# Patient Record
Sex: Male | Born: 1985 | Race: White | Hispanic: No
Health system: Southern US, Community
[De-identification: ages and names within clinical notes are randomized; demographics above are authoritative.]

---

## 2020-01-16 ENCOUNTER — Ambulatory Visit (INDEPENDENT_AMBULATORY_CARE_PROVIDER_SITE_OTHER): Payer: Self-pay

## 2020-01-16 ENCOUNTER — Other Ambulatory Visit: Payer: Self-pay

## 2020-01-16 ENCOUNTER — Encounter: Payer: Self-pay | Admitting: Family Medicine

## 2020-01-16 ENCOUNTER — Ambulatory Visit (INDEPENDENT_AMBULATORY_CARE_PROVIDER_SITE_OTHER): Payer: Self-pay | Admitting: Family Medicine

## 2020-01-16 VITALS — BP 142/90 | HR 83 | Ht 73.0 in | Wt 141.0 lb

## 2020-01-16 DIAGNOSIS — M79604 Pain in right leg: Secondary | ICD-10-CM

## 2020-01-16 DIAGNOSIS — G8929 Other chronic pain: Secondary | ICD-10-CM

## 2020-01-16 DIAGNOSIS — M2550 Pain in unspecified joint: Secondary | ICD-10-CM

## 2020-01-16 DIAGNOSIS — M545 Low back pain, unspecified: Secondary | ICD-10-CM | POA: Insufficient documentation

## 2020-01-16 DIAGNOSIS — M542 Cervicalgia: Secondary | ICD-10-CM

## 2020-01-16 DIAGNOSIS — M79605 Pain in left leg: Secondary | ICD-10-CM

## 2020-01-16 DIAGNOSIS — M5440 Lumbago with sciatica, unspecified side: Secondary | ICD-10-CM

## 2020-01-16 MED ORDER — PREDNISONE 50 MG PO TABS: ORAL_TABLET | ORAL | 0 refills | Status: DC

## 2020-01-16 NOTE — Progress Notes (Signed)
[facility] [address] [place] [address] Phone: [phone] Subjective:   I Ronelle Nigh am serving as a Neurosurgeon for Dr. Antoine Primas.  This visit occurred during the SARS-CoV-2 public health emergency.  Safety protocols were in place, including screening questions prior to the visit, additional usage of staff PPE, and extensive cleaning of exam room while observing appropriate contact time as indicated for disinfecting solutions.   I'm seeing this patient by the request  of:  Patient, No Pcp Per  CC: Back and neck pain  [id number]  Carl Jenkins is a 34 y.o. male coming in with complaint of back and neck pain. Pain for 2 years. States he has had pain since 5 years ago. Patient states he was moving something one day and has felt pain since. Numbness and tingling off and on in the feet (Has lasted longer than 6 hours). Has been stretching, ice, ibuprofen, tylenol, gabapentin and heat. Flexion, standing and sitting for long periods of time causes pain. 9/10 at its worse. Neck ROM is limited. Pain causes headaches at time. Patient is still some other specialist previously.  Has had work-up including some laboratory work-up which was reviewed by me today.  Reviewing some of his lines show negative rheumatoid factor, negative Lyme titers regular CBC.  Patient also had an MRI of the lumbar spine was independently visualized by me.  MRI does show the patient has some very mild degenerative disc disease but otherwise no significant nerve root impingement noted.       Social History   Socioeconomic History  . Marital status: Unknown    Spouse name: Not on file  . Number of children: Not on file  . Years of education: Not on file  . Highest education level: Not on file  Occupational History  . Not on file  Tobacco Use  . Smoking status: Not on file  Substance and Sexual Activity  . Alcohol use: Not on file  . Drug use: Not on file  . Sexual  activity: Not on file  Other Topics Concern  . Not on file  Social History Narrative  . Not on file   Social Determinants of Health   Financial Resource Strain:   . Difficulty of Paying Living Expenses: Not on file  Food Insecurity:   . Worried About Programme researcher, broadcasting/film/video in the Last Year: Not on file  . Ran Out of Food in the Last Year: Not on file  Transportation Needs:   . Lack of Transportation (Medical): Not on file  . Lack of Transportation (Non-Medical): Not on file  Physical Activity:   . Days of Exercise per Week: Not on file  . Minutes of Exercise per Session: Not on file  Stress:   . Feeling of Stress : Not on file  Social Connections:   . Frequency of Communication with Friends and Family: Not on file  . Frequency of Social Gatherings with Friends and Family: Not on file  . Attends Religious Services: Not on file  . Active Member of Clubs or Organizations: Not on file  . Attends Banker Meetings: Not on file  . Marital Status: Not on file   Not on File No family history on file.  Current Outpatient Medications (Endocrine & Metabolic):  .  predniSONE (DELTASONE) 50 MG tablet, 1 tablet by mouth daily        Reviewed prior external information including notes and imaging from  primary care provider  As well as notes that were available from care everywhere and other healthcare systems.  Past medical history, social, surgical and family history all reviewed in electronic medical record.  No pertanent information unless stated regarding to the chief complaint.   Review of Systems:  No headache, visual changes, nausea, vomiting, diarrhea, constipation, dizziness, abdominal pain, skin rash, fevers, chills, night sweats,  swollen lymph nodes,, joint swelling, chest pain, shortness of breath, mood changes. POSITIVE muscle aches, body aches, weight loss of 40 pounds over the last 2 years  Objective  Blood pressure (!) 142/90, pulse 83, height 6\' 1"   (1.854 m), weight 141 lb (64 kg), SpO2 97 %.   General: No apparent distress alert and oriented x3 mood and affect normal, dressed appropriately.  HEENT: Pupils equal, extraocular movements intact  Respiratory: Patient's speak in full sentences and does not appear short of breath  Patient has 3+ DTRs that are symmetric.  Patient does have 1 beat of clonus of the lower extremities bilaterally.  Significant stiffness noted of the lower extremities. Cardiovascular: No lower extremity edema, non tender, no erythema.  Gait normal with good balance and coordination.  MSK: Low back exam does have significant loss of lordosis.  Patient has very minimal flexion of the back at all.  Patient is neurovascularly intact distally.  Strength 4 out of 5 but symmetric.    Impression and Recommendations:     The above documentation has been reviewed and is accurate and complete , DO       Note: This dictation was prepared with Dragon dictation along with smaller phrase technology. Any transcriptional errors that result from this process are unintentional.

## 2020-01-16 NOTE — Patient Instructions (Addendum)
Good to see you.  Labs today Neck xray Prednisone 50 mg daily for 5 days Nerve conduction study bilateral LE they will call you to schedule  See me again in 5-6 weeks

## 2020-01-16 NOTE — Assessment & Plan Note (Addendum)
patient has had low back pain for no significant amount of time at this time.  We discussed which activities to do which wants to avoid.  Patient has had some laboratory work done but not significant and I would like to rule out all autoimmune.  Ionized calcium levels and I do think a nerve conduction test would be beneficial with patient's MRI being fairly unremarkable.  5-day burst of prednisone given as well to further evaluate.  After further work-up we will see how patient is doing and how he responds to the prednisone and discuss further.

## 2020-01-17 ENCOUNTER — Encounter: Payer: Self-pay | Admitting: Neurology

## 2020-01-18 LAB — COMPREHENSIVE METABOLIC PANEL
AG Ratio: 2.5 (calc) (ref 1.0–2.5)
ALT: 18 U/L (ref 9–46)
AST: 17 U/L (ref 10–40)
Albumin: 4.9 g/dL (ref 3.6–5.1)
Alkaline phosphatase (APISO): 54 U/L (ref 36–130)
BUN: 8 mg/dL (ref 7–25)
CO2: 30 mmol/L (ref 20–32)
Calcium: 9.7 mg/dL (ref 8.6–10.3)
Chloride: 101 mmol/L (ref 98–110)
Creat: 0.93 mg/dL (ref 0.60–1.35)
Globulin: 2 g/dL (calc) (ref 1.9–3.7)
Glucose, Bld: 95 mg/dL (ref 65–99)
Potassium: 3.7 mmol/L (ref 3.5–5.3)
Sodium: 139 mmol/L (ref 135–146)
Total Bilirubin: 0.8 mg/dL (ref 0.2–1.2)
Total Protein: 6.9 g/dL (ref 6.1–8.1)

## 2020-01-18 LAB — CBC WITH DIFFERENTIAL/PLATELET
Absolute Monocytes: 422 cells/uL (ref 200–950)
Basophils Absolute: 30 cells/uL (ref 0–200)
Basophils Relative: 0.4 %
Eosinophils Absolute: 30 cells/uL (ref 15–500)
Eosinophils Relative: 0.4 %
HCT: 43.1 % (ref 38.5–50.0)
Hemoglobin: 14.4 g/dL (ref 13.2–17.1)
Lymphs Abs: 1473 cells/uL (ref 850–3900)
MCH: 31.1 pg (ref 27.0–33.0)
MCHC: 33.4 g/dL (ref 32.0–36.0)
MCV: 93.1 fL (ref 80.0–100.0)
MPV: 10.5 fL (ref 7.5–12.5)
Monocytes Relative: 5.7 %
Neutro Abs: 5446 cells/uL (ref 1500–7800)
Neutrophils Relative %: 73.6 %
Platelets: 248 10*3/uL (ref 140–400)
RBC: 4.63 10*6/uL (ref 4.20–5.80)
RDW: 11.5 % (ref 11.0–15.0)
Total Lymphocyte: 19.9 %
WBC: 7.4 10*3/uL (ref 3.8–10.8)

## 2020-01-18 LAB — RPR: RPR Ser Ql: NONREACTIVE

## 2020-01-18 LAB — CK: Total CK: 93 U/L (ref 44–196)

## 2020-01-18 LAB — TESTOSTERONE TOTAL,FREE,BIO, MALES
Albumin: 4.9 g/dL (ref 3.6–5.1)
Sex Hormone Binding: 87 nmol/L — ABNORMAL HIGH (ref 10–50)
Testosterone, Bioavailable: 91.3 ng/dL — ABNORMAL LOW (ref >=110.0)
Testosterone, Free: 40.9 pg/mL — ABNORMAL LOW (ref 46.0–224.0)
Testosterone: 715 ng/dL (ref 250–827)

## 2020-01-18 LAB — TRANSFERRIN: Transferrin: 232 mg/dL (ref 188–341)

## 2020-01-18 LAB — SEDIMENTATION RATE: Sed Rate: 2 mm/h (ref 0–15)

## 2020-01-18 LAB — CALCIUM, IONIZED: Calcium, Ion: 4.9 mg/dL (ref 4.8–5.6)

## 2020-01-18 LAB — PTH, INTACT AND CALCIUM
Calcium: 9.7 mg/dL (ref 8.6–10.3)
PTH: 19 pg/mL (ref 14–64)

## 2020-01-18 LAB — HEMOGLOBIN A1C
Hgb A1c MFr Bld: 5 % of total Hgb (ref <5.7)
Mean Plasma Glucose: 97 (calc)
eAG (mmol/L): 5.4 (calc)

## 2020-01-18 LAB — ANGIOTENSIN CONVERTING ENZYME: Angiotensin-Converting Enzyme: 19 U/L (ref 9–67)

## 2020-01-18 LAB — TSH: TSH: 1.02 mIU/L (ref 0.40–4.50)

## 2020-01-18 LAB — RHEUMATOID FACTOR: Rheumatoid fact SerPl-aCnc: <14 IU/mL (ref <14)

## 2020-01-18 LAB — HIV ANTIBODY (ROUTINE TESTING W REFLEX): HIV 1&2 Ab, 4th Generation: NONREACTIVE

## 2020-01-18 LAB — VITAMIN D 25 HYDROXY (VIT D DEFICIENCY, FRACTURES): Vit D, 25-Hydroxy: 49 ng/mL (ref 30–100)

## 2020-01-18 LAB — CYCLIC CITRUL PEPTIDE ANTIBODY, IGG: Cyclic Citrullin Peptide Ab: <16 UNITS

## 2020-01-18 LAB — C-REACTIVE PROTEIN: CRP: 0.8 mg/L (ref <8.0)

## 2020-01-18 LAB — ANA: Anti Nuclear Antibody (ANA): NEGATIVE

## 2020-01-18 LAB — FERRITIN: Ferritin: 103 ng/mL (ref 38–380)

## 2020-01-18 LAB — HLA-B27 ANTIGEN: HLA-B27 Antigen: POSITIVE — AB

## 2020-01-21 ENCOUNTER — Other Ambulatory Visit: Payer: Self-pay

## 2020-01-21 DIAGNOSIS — R202 Paresthesia of skin: Secondary | ICD-10-CM

## 2020-01-25 ENCOUNTER — Telehealth: Payer: Self-pay | Admitting: Family Medicine

## 2020-01-25 NOTE — Telephone Encounter (Signed)
Patient called stating that he noticed a lot of improvement when taking the predniSONE (DELTASONE) 50 MG tablet. After stopping, his issues returned. He asked if it was okay for him to get a refill on this to see if it would help or what Dr Katrinka Blazing would suggest.

## 2020-01-28 MED ORDER — PREDNISONE 50 MG PO TABS
ORAL_TABLET | ORAL | 0 refills | Status: DC
Start: 2020-01-28 — End: 2020-01-31

## 2020-01-29 NOTE — Telephone Encounter (Signed)
Left message for patient with MD response. 

## 2020-01-31 ENCOUNTER — Other Ambulatory Visit: Payer: Self-pay

## 2020-01-31 MED ORDER — PREDNISONE 50 MG PO TABS: ORAL_TABLET | ORAL | 0 refills | Status: DC

## 2020-01-31 NOTE — Telephone Encounter (Signed)
Patient notified

## 2020-01-31 NOTE — Telephone Encounter (Signed)
Can this be resent to [facility] at [address] [place], [place] please?

## 2020-01-31 NOTE — Telephone Encounter (Signed)
Medication sent into pharmacy in [place].

## 2020-02-26 ENCOUNTER — Ambulatory Visit (INDEPENDENT_AMBULATORY_CARE_PROVIDER_SITE_OTHER): Payer: PRIVATE HEALTH INSURANCE | Admitting: Family Medicine

## 2020-02-26 ENCOUNTER — Ambulatory Visit (INDEPENDENT_AMBULATORY_CARE_PROVIDER_SITE_OTHER): Payer: PRIVATE HEALTH INSURANCE | Admitting: Neurology

## 2020-02-26 ENCOUNTER — Other Ambulatory Visit: Payer: Self-pay

## 2020-02-26 ENCOUNTER — Encounter: Payer: Self-pay | Admitting: Family Medicine

## 2020-02-26 DIAGNOSIS — R202 Paresthesia of skin: Secondary | ICD-10-CM | POA: Diagnosis not present

## 2020-02-26 DIAGNOSIS — M999 Biomechanical lesion, unspecified: Secondary | ICD-10-CM | POA: Insufficient documentation

## 2020-02-26 DIAGNOSIS — Z1589 Genetic susceptibility to other disease: Secondary | ICD-10-CM | POA: Diagnosis not present

## 2020-02-26 DIAGNOSIS — M5440 Lumbago with sciatica, unspecified side: Secondary | ICD-10-CM

## 2020-02-26 DIAGNOSIS — G8929 Other chronic pain: Secondary | ICD-10-CM | POA: Diagnosis not present

## 2020-02-26 MED ORDER — VITAMIN D (ERGOCALCIFEROL) 1.25 MG (50000 UNIT) PO CAPS: 50000.0000 [IU] | ORAL_CAPSULE | ORAL | 0 refills | Status: AC

## 2020-02-26 NOTE — Assessment & Plan Note (Signed)
   Decision today to treat with OMT was based on Physical Exam  After verbal consent patient was treated with HVLA, ME, FPR techniques in pelvis lumbar and sacral areas, all areas are chronic   Patient tolerated the procedure well with improvement in symptoms  Patient given exercises, stretches and lifestyle modifications  See medications in patient instructions if given  Patient will follow up in 4-8 weeks

## 2020-02-26 NOTE — Patient Instructions (Signed)
Low back exercises Once weekly vitamin D for 12 weeks  Continue other medications Tried manipulation See me again in 6-7 weeks

## 2020-02-26 NOTE — Assessment & Plan Note (Signed)
No sign of arthritis but we will continue to monitor.

## 2020-02-26 NOTE — Progress Notes (Signed)
[facility] [place] Phone: [phone] Subjective:    I'm seeing this patient by the request  of:  Patient, No Pcp Per  CC: Back pain follow-up  [id number]   01/16/2020 patient has had low back pain for no significant amount of time at this time.  We discussed which activities to do which wants to avoid.  Patient has had some laboratory work done but not significant and I would like to rule out all autoimmune.  Ionized calcium levels and I do think a nerve conduction test would be beneficial with patient's MRI being fairly unremarkable.  5-day burst of prednisone given as well to further evaluate.  After further work-up we will see how patient is doing and how he responds to the prednisone and discuss further.  Update 02/26/2020 Carl Jenkins is a 34 y.o. male coming in with complaint of low back pain. Patient states that his back pain has been somewhat better. Is on low dose naltrexone at the moment.  Patient has noticed some mild improvement with this.    Patient was having low back pain he did 2 rounds of prednisone. Patient did have neck x-rays done at last exam.  These were independently visualized by me showing no significant bony abnormality  Patient previously also had an MRI of the lumbar spine that was independently visualized by me showing mild degenerative disc disease but otherwise negative.  We have ordered a nerve conduction study but has been done today at 8 AM.  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  Impression: This is a normal study of the lower extremities.  In particular, there is no evidence of a lumbosacral radiculopathy or sensorimotor polyneuropathy.     No past medical history on file. No past surgical history on file. Social History   Socioeconomic History   Marital status:  Unknown    Spouse name: Not on file   Number of children: Not on file   Years of education: Not on file   Highest education level: Not on file  Occupational History   Not on file  Tobacco Use   Smoking status: Not on file  Substance and Sexual Activity   Alcohol use: Not on file   Drug use: Not on file   Sexual activity: Not on file  Other Topics Concern   Not on file  Social History Narrative   Not on file   Social Determinants of Health   Financial Resource Strain:    Difficulty of Paying Living Expenses: Not on file  Food Insecurity:    Worried About [place] in the Last Year: Not on file   Ran Out of Food in the Last Year: Not on file  Transportation Needs:    Lack of Transportation (Medical): Not on file   Lack of Transportation (Non-Medical): Not on file  Physical Activity:    Days of Exercise per Week: Not on file   Minutes of Exercise per Session: Not on file  Stress:    Feeling of Stress : Not on file  Social Connections:    Frequency of Communication with Friends and Family: Not on file   Frequency of Social Gatherings with Friends and Family: Not on file   Attends Religious Services: Not on file   Active Member of Clubs or Organizations: Not on file   Attends  Archivist Meetings: Not on file   Marital Status: Not on file   Not on File No family history on file.       Current Outpatient Medications (Other):    Naltrexone 200-6.5 MG IMPL, Inject into the skin.   Vitamin D, Ergocalciferol, (DRISDOL) 1.25 MG (50000 UNIT) CAPS capsule, Take 1 capsule (50,000 Units total) by mouth every 7 (seven) days.   Reviewed prior external information including notes and imaging from  primary care provider As well as notes that were available from care everywhere and other healthcare systems.  Past medical history, social, surgical and family history all reviewed in electronic medical record.  No pertanent information  unless stated regarding to the chief complaint.   Review of Systems:  No headache, visual changes, nausea, vomiting, diarrhea, constipation, dizziness, abdominal pain, skin rash, fevers, chills, night sweats, weight loss, swollen lymph nodes, joint swelling, chest pain, shortness of breath, mood changes. POSITIVE muscle aches, body aches  Objective  Blood pressure 110/68, pulse 75, height '6\' 1"'  (1.854 m), weight 142 lb (64.4 kg), SpO2 98 %.   General: No apparent distress alert and oriented x3 mood and affect normal, dressed appropriately.  HEENT: Pupils equal, extraocular movements intact  Respiratory: Patient's speak in full sentences and does not appear short of breath  Cardiovascular: No lower extremity edema, non tender, no erythema  Neuro: Cranial nerves II through XII are intact, neurovascularly intact in all extremities with 2+ DTRs and 2+ pulses.  Gait normal with good balance and coordination.  MSK:  Non tender with full range of motion and good stability and symmetric strength and tone of shoulders, elbows, wrist, hip, knee and ankles bilaterally.  Patient does have some stiffness noted of some of the joints though.  Back exam does have some significant loss of lordosis.  Patient can only flex to about 30 degrees.  Extension of 5 degrees causes some discomfort and pain.  Mild tightness with FABER test.  Significant tightness of the hamstrings but no radicular symptoms.  Osteopathic findings L5 flexed rotated and side bent right Sacrum left on left Left anterior ilium    Impression and Recommendations:     The above documentation has been reviewed and is accurate and complete Lyndal Pulley, DO

## 2020-02-26 NOTE — Assessment & Plan Note (Signed)
Patient is making some mild improvement.  Work-up did not show anything significantly.  Start on once weekly vitamin D for the next 12 weeks.  HLA-B27 is positive we will monitor.  ANA was negative.  Patient's MRI previously has been fairly unremarkable.  Increase activity slowly and start regaining some strength.  Follow-up with me again in 6 to 8 weeks.  Attempted osteopathic manipulation today 

## 2020-02-26 NOTE — Procedures (Signed)
[facility]  [address] [place], [address]  [place], [place] [address] Tel: [phone] Fax:  [phone] Test Date:  02/26/2020  Patient: Carl Jenkins DOB: 11-07-1985 Physician: Nita Sickle, DO  Sex: Male Height: 6\' 1"  Ref Phys: , DO  ID#: [id number] Primas Temp: 34.0C Technician:    Patient Complaints: This is a 34 year-old man referred for evaluation of bilateral leg pain and paresthesias.   NCV & EMG Findings: Extensive electrodiagnostic testing of the right lower extremity and additional studies of the left shows: 1. Bilateral sural and superficial peroneal sensory responses are within normal limits. 2. Bilateral peroneal and tibial motor responses are within normal limits. 3. Bilateral tibial H reflex studies are within normal limits. 4. There is no evidence of active or chronic motor axon loss changes affecting any of the tested muscles.  Motor unit recruitment and activation is within normal limits.  Impression: This is a normal study of the lower extremities.  In particular, there is no evidence of a lumbosacral radiculopathy or sensorimotor polyneuropathy.    ___________________________ 34, DO    Nerve Conduction Studies Anti Sensory Summary Table   Stim Site NR Peak (ms) Norm Peak (ms) P-T Amp (V) Norm P-T Amp  Left Sup Peroneal Anti Sensory (Ant Lat Mall)  34C  12 cm    3.5 <4.5 12.5 >5  Right Sup Peroneal Anti Sensory (Ant Lat Mall)  34C  12 cm    2.6 <4.5 14.2 >5  Left Sural Anti Sensory (Lat Mall)  34C  Calf    3.1 <4.5 21.5 >5  Right Sural Anti Sensory (Lat Mall)  34C  Calf    3.1 <4.5 22.6 >5   Motor Summary Table   Stim Site NR Onset (ms) Norm Onset (ms) O-P Amp (mV) Norm O-P Amp Site1 Site2 Delta-0 (ms) Dist (cm) Vel (m/s) Norm Vel (m/s)  Left Peroneal Motor (Ext Dig Brev)  34C  Ankle    5.4 <5.5 3.5 >3 B Fib Ankle 9.8 43.0 44 >40  B Fib    15.2  3.5  Poplt B Fib 1.7 10.0 59 >40  Poplt    16.9  3.4         Right  Peroneal Motor (Ext Dig Brev)  34C  Ankle    4.6 <5.5 3.5 >3 B Fib Ankle 8.0 41.0 51 >40  B Fib    12.6  3.3  Poplt B Fib 1.8 10.0 56 >40  Poplt    14.4  3.1         Left Tibial Motor (Abd Hall Brev)  34C  Ankle    3.6 <6.0 16.0 >8 Knee Ankle 10.8 46.0 43 >40  Knee    14.4  12.0         Right Tibial Motor (Abd Hall Brev)  34C  Ankle    4.7 <6.0 15.7 >8 Knee Ankle 9.9 45.0 45 >40  Knee    14.6  12.5          H Reflex Studies   NR H-Lat (ms) Lat Norm (ms) L-R H-Lat (ms)  Left Tibial (Gastroc)  34C     32.24 <35 3.54  Right Tibial (Gastroc)  34C     34.98 <35 3.54   EMG   Side Muscle Ins Act Fibs Psw Fasc Number Recrt Dur Dur. Amp Amp. Poly Poly. Comment  Right AntTibialis Nml Nml Nml Nml Nml Nml Nml Nml Nml Nml Nml Nml N/A  Right Gastroc Nml Nml Nml  Nml Nml Nml Nml Nml Nml Nml Nml Nml N/A  Right Flex Dig Long Nml Nml Nml Nml Nml Nml Nml Nml Nml Nml Nml Nml N/A  Right RectFemoris Nml Nml Nml Nml Nml Nml Nml Nml Nml Nml Nml Nml N/A  Right GluteusMed Nml Nml Nml Nml Nml Nml Nml Nml Nml Nml Nml Nml N/A  Left AntTibialis Nml Nml Nml Nml Nml Nml Nml Nml Nml Nml Nml Nml N/A  Left Gastroc Nml Nml Nml Nml Nml Nml Nml Nml Nml Nml Nml Nml N/A  Left Flex Dig Long Nml Nml Nml Nml Nml Nml Nml Nml Nml Nml Nml Nml N/A  Left RectFemoris Nml Nml Nml Nml Nml Nml Nml Nml Nml Nml Nml Nml N/A  Left GluteusMed Nml Nml Nml Nml Nml Nml Nml Nml Nml Nml Nml Nml N/A      Waveforms:

## 2020-04-08 ENCOUNTER — Ambulatory Visit: Payer: PRIVATE HEALTH INSURANCE | Admitting: Family Medicine

## 2021-09-27 IMAGING — DX DG CERVICAL SPINE COMPLETE 4+V
5 series · 5 of 5 positions shown · non-contrast
Comparison: None.

CLINICAL DATA: Stiffness and pain

EXAM:
CERVICAL SPINE - COMPLETE 4+ VIEW

[c-spine lat]
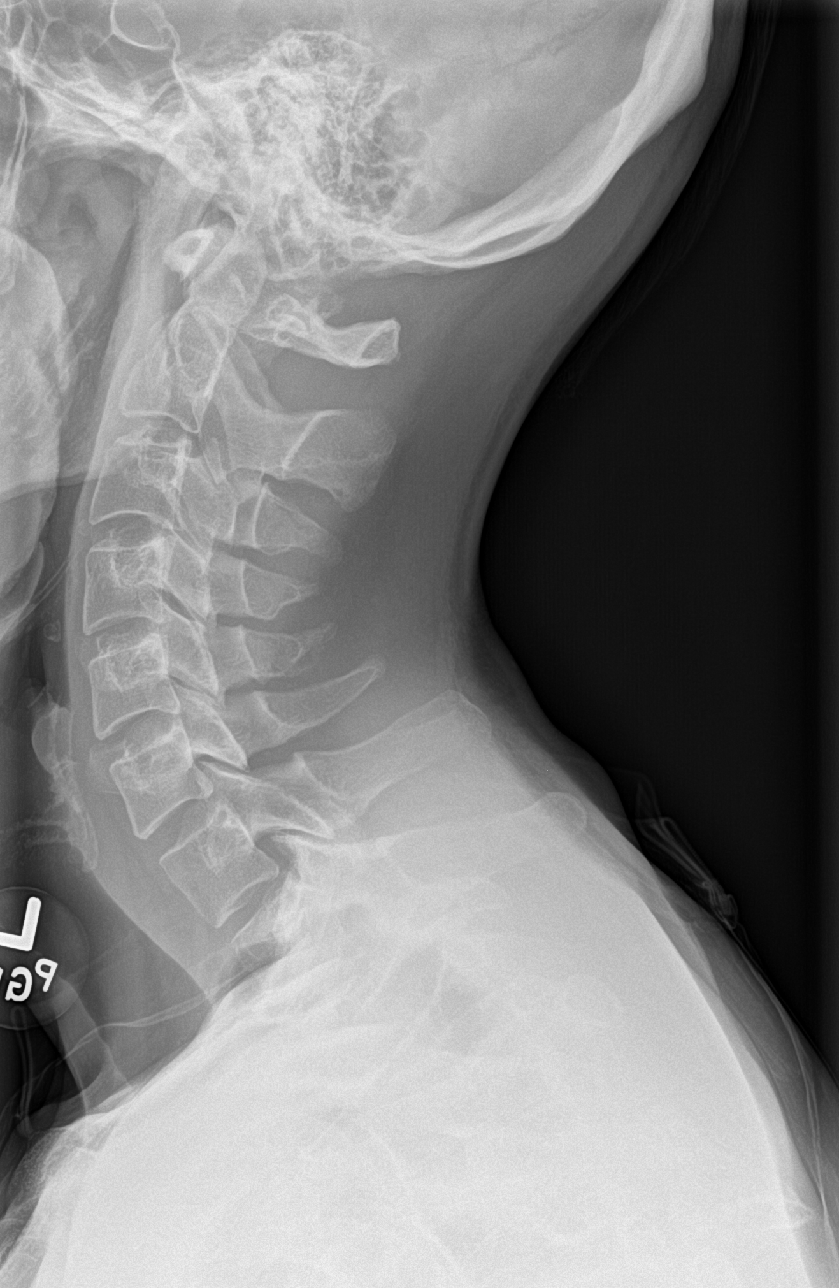

[c-spine obl (1 of 2)]
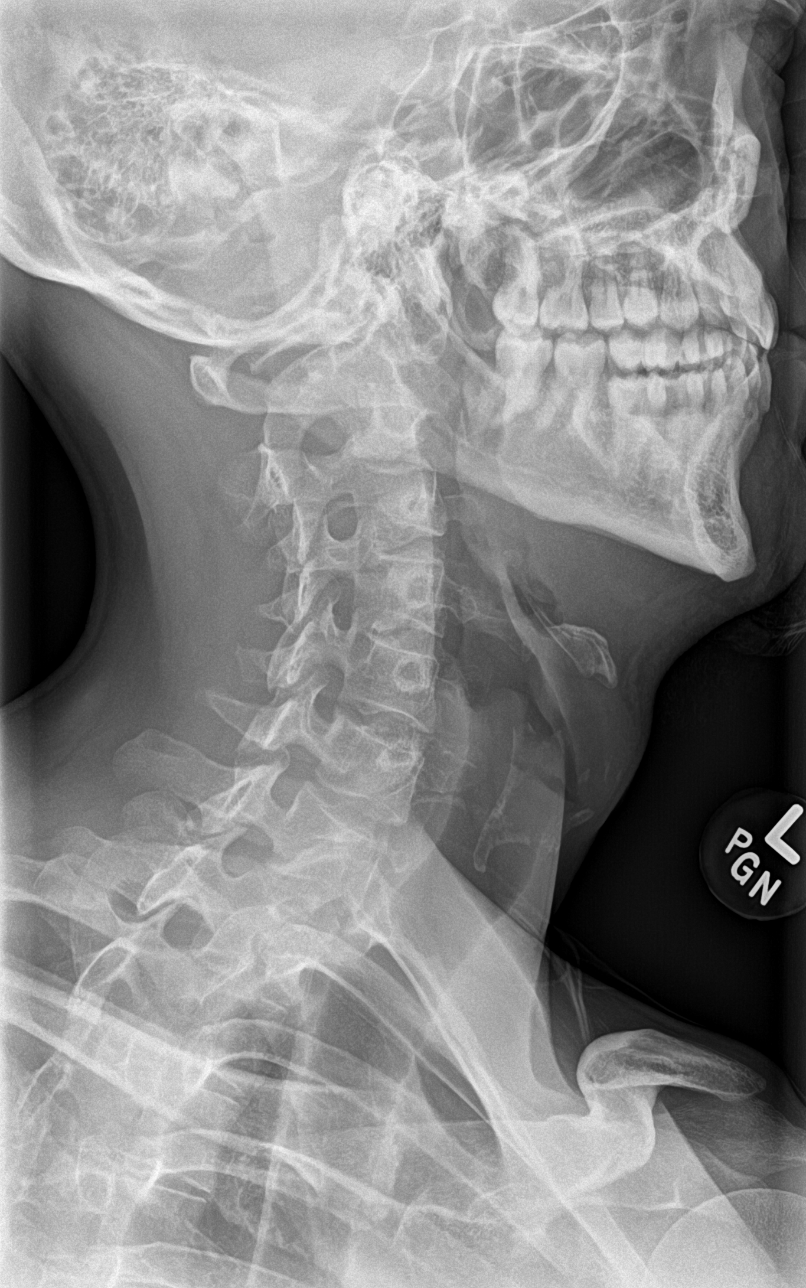

[c-spine obl (2 of 2)]
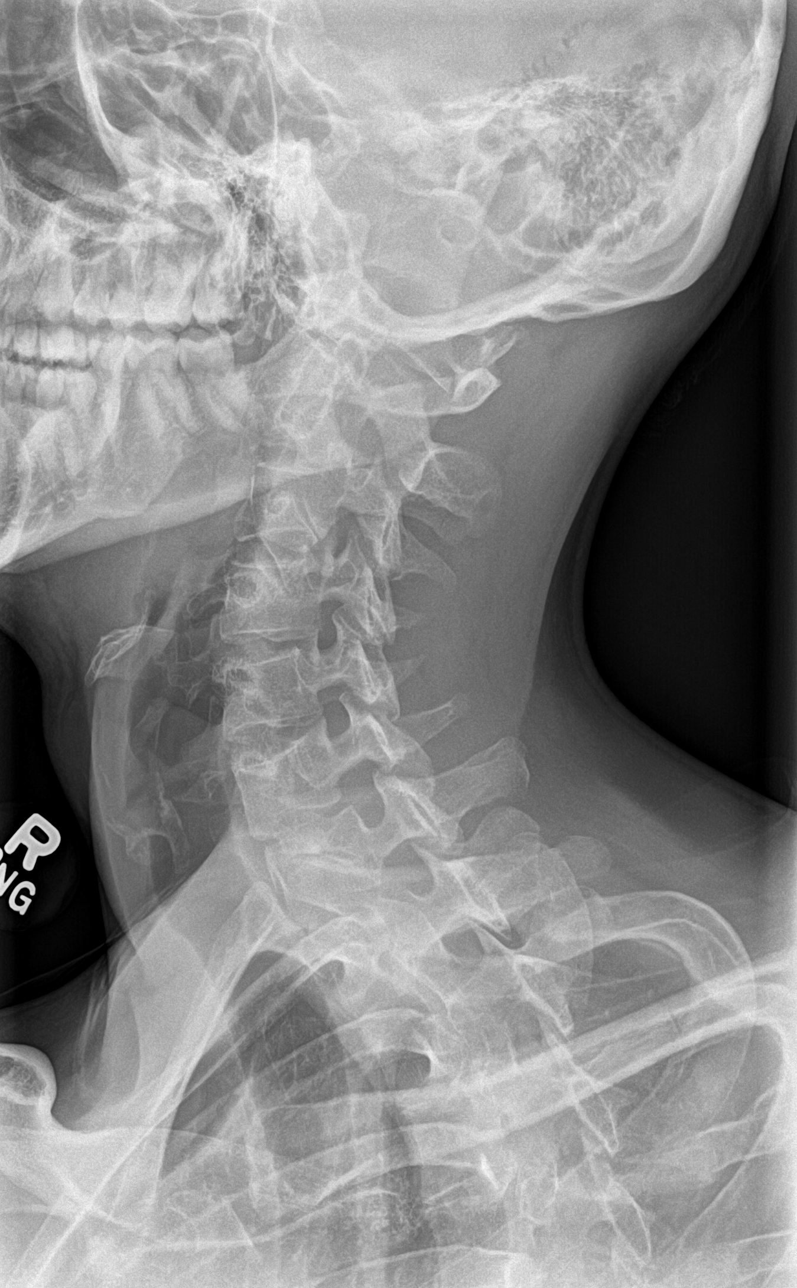

[c-spine ap]
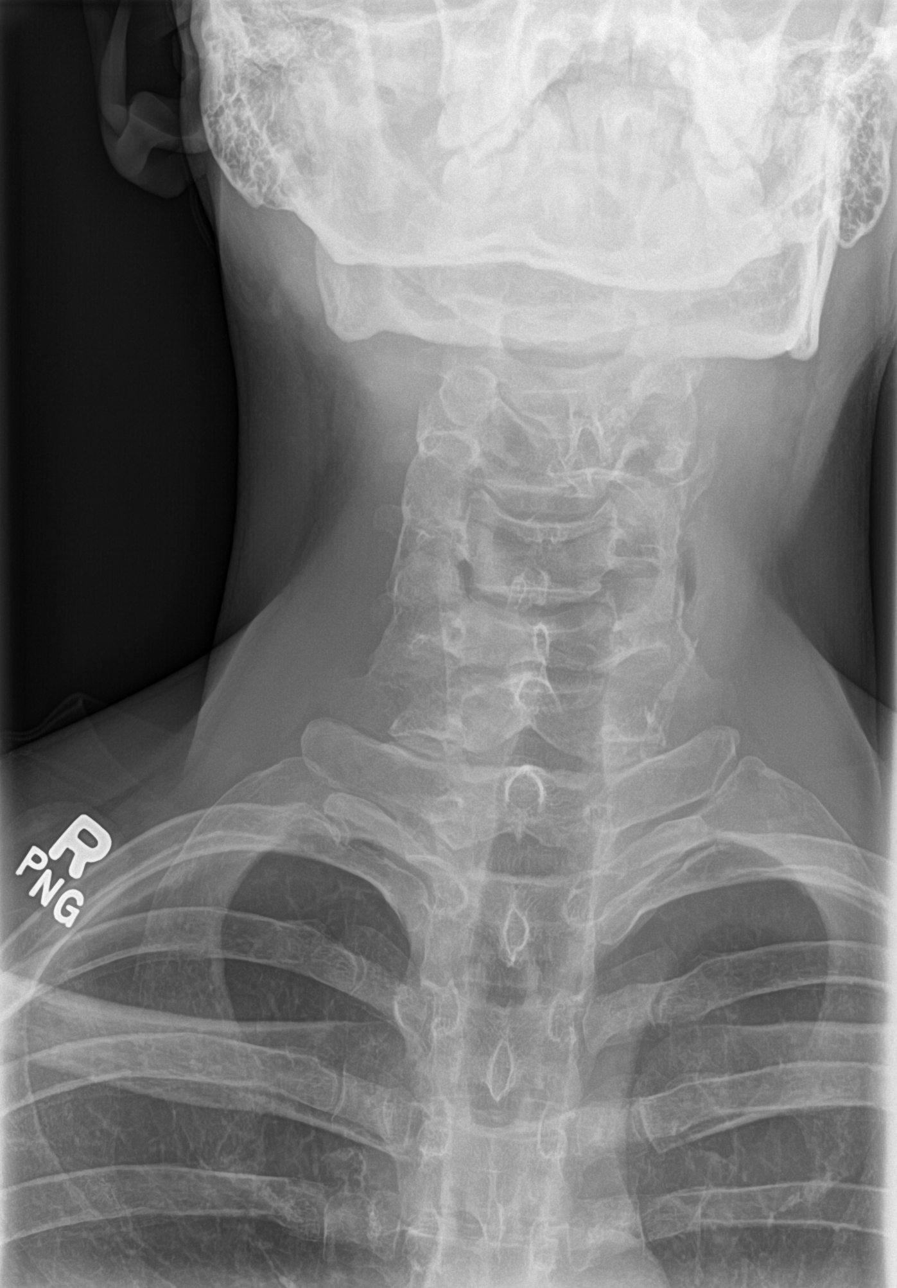

[c-spine open mouth]
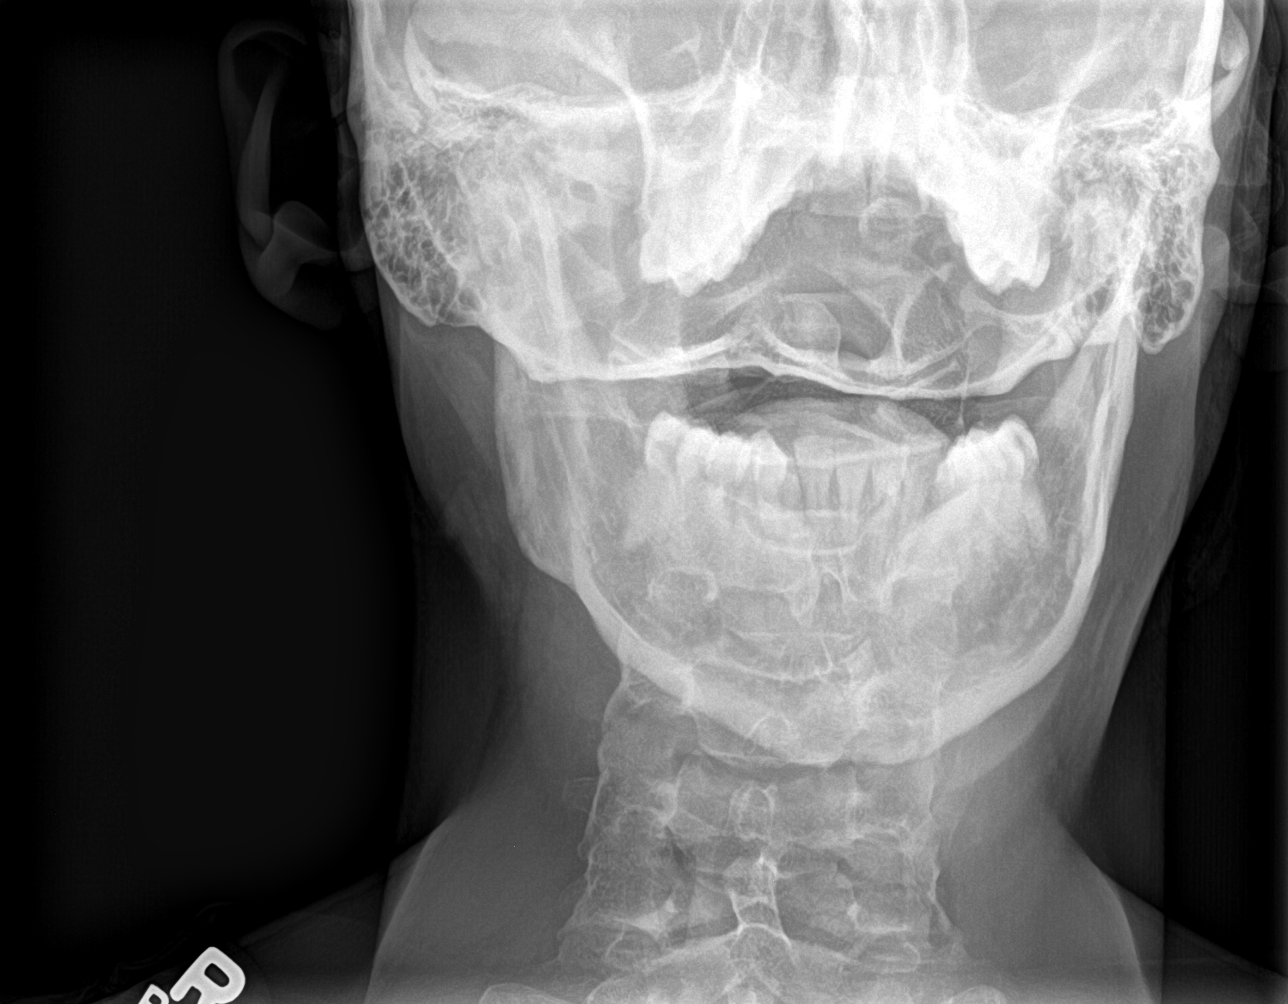

[5 of 5 positions shown; findings below may reference images not displayed]

FINDINGS: There is no evidence of cervical spine fracture or prevertebral soft
tissue swelling. Alignment is normal. Dens and lateral masses are
poorly evaluated secondary to osseous overlap and positioning.
IMPRESSION: Negative cervical spine radiographs. Dens and lateral masses are
poorly evaluated secondary to osseous overlap and positioning.
